# Patient Record
Sex: Male | Born: 1980 | Race: Black or African American | Hispanic: No | Marital: Single
Health system: Southern US, Community
[De-identification: ages and names within clinical notes are randomized; demographics above are authoritative.]

## PROBLEM LIST (undated history)

## (undated) DIAGNOSIS — I10 Essential (primary) hypertension: Secondary | ICD-10-CM

## (undated) DIAGNOSIS — E785 Hyperlipidemia, unspecified: Secondary | ICD-10-CM

## (undated) HISTORY — DX: Hyperlipidemia, unspecified: E78.5

## (undated) HISTORY — DX: Essential (primary) hypertension: I10

---
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
## 2010-02-08 HISTORY — PX: SHOULDER ARTHROSCOPY: SHX128

## 2010-09-26 ENCOUNTER — Emergency Department (HOSPITAL_COMMUNITY)
Admission: EM | Admit: 2010-09-26 | Discharge: 2010-09-26 | Disposition: A | Discharge status: Home / Self Care | Payer: Federal, State, Local not specified - PPO | Attending: Emergency Medicine | Admitting: Emergency Medicine

## 2010-09-26 ENCOUNTER — Emergency Department (HOSPITAL_COMMUNITY): Payer: Federal, State, Local not specified - PPO

## 2010-09-26 DIAGNOSIS — M25519 Pain in unspecified shoulder: Secondary | ICD-10-CM | POA: Insufficient documentation

## 2010-09-26 DIAGNOSIS — M7989 Other specified soft tissue disorders: Secondary | ICD-10-CM | POA: Insufficient documentation

## 2010-09-26 DIAGNOSIS — IMO0002 Reserved for concepts with insufficient information to code with codable children: Secondary | ICD-10-CM | POA: Insufficient documentation

## 2010-09-26 DIAGNOSIS — E7800 Pure hypercholesterolemia, unspecified: Secondary | ICD-10-CM | POA: Insufficient documentation

## 2010-09-26 DIAGNOSIS — S8000XA Contusion of unspecified knee, initial encounter: Secondary | ICD-10-CM | POA: Insufficient documentation

## 2010-09-26 DIAGNOSIS — M25569 Pain in unspecified knee: Secondary | ICD-10-CM | POA: Insufficient documentation

## 2010-09-26 DIAGNOSIS — W1801XA Striking against sports equipment with subsequent fall, initial encounter: Secondary | ICD-10-CM | POA: Insufficient documentation

## 2015-07-25 DIAGNOSIS — J029 Acute pharyngitis, unspecified: Secondary | ICD-10-CM | POA: Diagnosis not present

## 2015-08-13 DIAGNOSIS — M79674 Pain in right toe(s): Secondary | ICD-10-CM | POA: Diagnosis not present

## 2015-08-20 DIAGNOSIS — G4733 Obstructive sleep apnea (adult) (pediatric): Secondary | ICD-10-CM | POA: Diagnosis not present

## 2015-09-13 DIAGNOSIS — K080 Exfoliation of teeth due to systemic causes: Secondary | ICD-10-CM | POA: Diagnosis not present

## 2015-09-25 DIAGNOSIS — M25512 Pain in left shoulder: Secondary | ICD-10-CM | POA: Diagnosis not present

## 2016-03-29 DIAGNOSIS — K080 Exfoliation of teeth due to systemic causes: Secondary | ICD-10-CM | POA: Diagnosis not present

## 2016-05-22 DIAGNOSIS — K080 Exfoliation of teeth due to systemic causes: Secondary | ICD-10-CM | POA: Diagnosis not present

## 2016-08-20 DIAGNOSIS — E782 Mixed hyperlipidemia: Secondary | ICD-10-CM | POA: Diagnosis not present

## 2016-08-20 DIAGNOSIS — Z0000 Encounter for general adult medical examination without abnormal findings: Secondary | ICD-10-CM | POA: Diagnosis not present

## 2016-10-23 DIAGNOSIS — K080 Exfoliation of teeth due to systemic causes: Secondary | ICD-10-CM | POA: Diagnosis not present

## 2017-04-28 DIAGNOSIS — K080 Exfoliation of teeth due to systemic causes: Secondary | ICD-10-CM | POA: Diagnosis not present

## 2017-07-21 DIAGNOSIS — G4733 Obstructive sleep apnea (adult) (pediatric): Secondary | ICD-10-CM | POA: Diagnosis not present

## 2017-08-24 DIAGNOSIS — I10 Essential (primary) hypertension: Secondary | ICD-10-CM | POA: Diagnosis not present

## 2017-08-24 DIAGNOSIS — Z0000 Encounter for general adult medical examination without abnormal findings: Secondary | ICD-10-CM | POA: Diagnosis not present

## 2017-08-24 DIAGNOSIS — R7301 Impaired fasting glucose: Secondary | ICD-10-CM | POA: Diagnosis not present

## 2017-08-24 DIAGNOSIS — G4730 Sleep apnea, unspecified: Secondary | ICD-10-CM | POA: Diagnosis not present

## 2017-08-24 DIAGNOSIS — E7800 Pure hypercholesterolemia, unspecified: Secondary | ICD-10-CM | POA: Diagnosis not present

## 2017-11-03 DIAGNOSIS — K080 Exfoliation of teeth due to systemic causes: Secondary | ICD-10-CM | POA: Diagnosis not present

## 2017-11-21 DIAGNOSIS — G4733 Obstructive sleep apnea (adult) (pediatric): Secondary | ICD-10-CM | POA: Diagnosis not present

## 2018-02-27 DIAGNOSIS — R7303 Prediabetes: Secondary | ICD-10-CM | POA: Diagnosis not present

## 2018-02-27 DIAGNOSIS — Z113 Encounter for screening for infections with a predominantly sexual mode of transmission: Secondary | ICD-10-CM | POA: Diagnosis not present

## 2018-02-27 DIAGNOSIS — I10 Essential (primary) hypertension: Secondary | ICD-10-CM | POA: Diagnosis not present

## 2018-02-27 DIAGNOSIS — G4730 Sleep apnea, unspecified: Secondary | ICD-10-CM | POA: Diagnosis not present

## 2018-02-27 DIAGNOSIS — E7800 Pure hypercholesterolemia, unspecified: Secondary | ICD-10-CM | POA: Diagnosis not present

## 2018-08-28 DIAGNOSIS — Z0000 Encounter for general adult medical examination without abnormal findings: Secondary | ICD-10-CM | POA: Diagnosis not present

## 2018-08-28 DIAGNOSIS — R7303 Prediabetes: Secondary | ICD-10-CM | POA: Diagnosis not present

## 2018-08-28 DIAGNOSIS — G4730 Sleep apnea, unspecified: Secondary | ICD-10-CM | POA: Diagnosis not present

## 2018-08-28 DIAGNOSIS — E7800 Pure hypercholesterolemia, unspecified: Secondary | ICD-10-CM | POA: Diagnosis not present

## 2018-08-28 DIAGNOSIS — I10 Essential (primary) hypertension: Secondary | ICD-10-CM | POA: Diagnosis not present

## 2018-09-26 DIAGNOSIS — G4733 Obstructive sleep apnea (adult) (pediatric): Secondary | ICD-10-CM | POA: Diagnosis not present

## 2018-11-06 DIAGNOSIS — K080 Exfoliation of teeth due to systemic causes: Secondary | ICD-10-CM | POA: Diagnosis not present

## 2019-01-26 ENCOUNTER — Ambulatory Visit: Payer: Federal, State, Local not specified - PPO | Attending: Internal Medicine

## 2019-01-26 ENCOUNTER — Other Ambulatory Visit: Payer: Self-pay

## 2019-01-26 DIAGNOSIS — Z20828 Contact with and (suspected) exposure to other viral communicable diseases: Secondary | ICD-10-CM | POA: Diagnosis not present

## 2019-01-26 DIAGNOSIS — Z20822 Contact with and (suspected) exposure to covid-19: Secondary | ICD-10-CM

## 2019-01-27 LAB — NOVEL CORONAVIRUS, NAA: SARS-CoV-2, NAA: NOT DETECTED

## 2019-03-01 DIAGNOSIS — E7800 Pure hypercholesterolemia, unspecified: Secondary | ICD-10-CM | POA: Diagnosis not present

## 2019-03-01 DIAGNOSIS — R7301 Impaired fasting glucose: Secondary | ICD-10-CM | POA: Diagnosis not present

## 2019-03-01 DIAGNOSIS — G4730 Sleep apnea, unspecified: Secondary | ICD-10-CM | POA: Diagnosis not present

## 2019-03-01 DIAGNOSIS — I10 Essential (primary) hypertension: Secondary | ICD-10-CM | POA: Diagnosis not present

## 2019-03-01 DIAGNOSIS — K921 Melena: Secondary | ICD-10-CM | POA: Diagnosis not present

## 2019-03-05 DIAGNOSIS — G4733 Obstructive sleep apnea (adult) (pediatric): Secondary | ICD-10-CM | POA: Diagnosis not present

## 2019-08-30 DIAGNOSIS — R7309 Other abnormal glucose: Secondary | ICD-10-CM | POA: Diagnosis not present

## 2019-08-30 DIAGNOSIS — G4730 Sleep apnea, unspecified: Secondary | ICD-10-CM | POA: Diagnosis not present

## 2019-08-30 DIAGNOSIS — R7303 Prediabetes: Secondary | ICD-10-CM | POA: Diagnosis not present

## 2019-08-30 DIAGNOSIS — K625 Hemorrhage of anus and rectum: Secondary | ICD-10-CM | POA: Diagnosis not present

## 2019-08-30 DIAGNOSIS — I10 Essential (primary) hypertension: Secondary | ICD-10-CM | POA: Diagnosis not present

## 2019-08-30 DIAGNOSIS — Z23 Encounter for immunization: Secondary | ICD-10-CM | POA: Diagnosis not present

## 2019-08-30 DIAGNOSIS — Z0000 Encounter for general adult medical examination without abnormal findings: Secondary | ICD-10-CM | POA: Diagnosis not present

## 2019-08-30 DIAGNOSIS — E7800 Pure hypercholesterolemia, unspecified: Secondary | ICD-10-CM | POA: Diagnosis not present

## 2019-10-12 DIAGNOSIS — G4733 Obstructive sleep apnea (adult) (pediatric): Secondary | ICD-10-CM | POA: Diagnosis not present

## 2020-02-22 DIAGNOSIS — Z20828 Contact with and (suspected) exposure to other viral communicable diseases: Secondary | ICD-10-CM | POA: Diagnosis not present

## 2020-02-29 DIAGNOSIS — I10 Essential (primary) hypertension: Secondary | ICD-10-CM | POA: Diagnosis not present

## 2020-02-29 DIAGNOSIS — E7800 Pure hypercholesterolemia, unspecified: Secondary | ICD-10-CM | POA: Diagnosis not present

## 2020-02-29 DIAGNOSIS — G4730 Sleep apnea, unspecified: Secondary | ICD-10-CM | POA: Diagnosis not present

## 2020-02-29 DIAGNOSIS — R7303 Prediabetes: Secondary | ICD-10-CM | POA: Diagnosis not present

## 2020-06-09 DIAGNOSIS — R1032 Left lower quadrant pain: Secondary | ICD-10-CM | POA: Diagnosis not present

## 2020-06-12 ENCOUNTER — Other Ambulatory Visit: Payer: Self-pay

## 2020-06-12 ENCOUNTER — Other Ambulatory Visit: Payer: Self-pay | Admitting: Internal Medicine

## 2020-06-12 ENCOUNTER — Ambulatory Visit
Admission: RE | Admit: 2020-06-12 | Discharge: 2020-06-12 | Disposition: A | Discharge status: Home / Self Care | Payer: Federal, State, Local not specified - PPO | Source: Ambulatory Visit | Attending: Internal Medicine | Admitting: Internal Medicine

## 2020-06-12 DIAGNOSIS — K5730 Diverticulosis of large intestine without perforation or abscess without bleeding: Secondary | ICD-10-CM | POA: Diagnosis not present

## 2020-06-12 DIAGNOSIS — R1032 Left lower quadrant pain: Secondary | ICD-10-CM

## 2020-06-12 DIAGNOSIS — R109 Unspecified abdominal pain: Secondary | ICD-10-CM | POA: Diagnosis not present

## 2020-09-17 DIAGNOSIS — I10 Essential (primary) hypertension: Secondary | ICD-10-CM | POA: Diagnosis not present

## 2020-09-17 DIAGNOSIS — E7800 Pure hypercholesterolemia, unspecified: Secondary | ICD-10-CM | POA: Diagnosis not present

## 2020-09-17 DIAGNOSIS — R7303 Prediabetes: Secondary | ICD-10-CM | POA: Diagnosis not present

## 2020-09-17 DIAGNOSIS — G4730 Sleep apnea, unspecified: Secondary | ICD-10-CM | POA: Diagnosis not present

## 2020-09-17 DIAGNOSIS — Z0000 Encounter for general adult medical examination without abnormal findings: Secondary | ICD-10-CM | POA: Diagnosis not present

## 2021-02-13 ENCOUNTER — Other Ambulatory Visit: Payer: Self-pay

## 2021-02-13 ENCOUNTER — Ambulatory Visit (HOSPITAL_BASED_OUTPATIENT_CLINIC_OR_DEPARTMENT_OTHER): Payer: Federal, State, Local not specified - PPO | Admitting: Orthopaedic Surgery

## 2021-02-13 DIAGNOSIS — M542 Cervicalgia: Secondary | ICD-10-CM

## 2021-02-13 NOTE — Progress Notes (Signed)
Chief Complaint: Right upper trapezial pain     History of Present Illness:    Maxi A Rocks is a 41 y.o. male with right upper trapezial pain ongoing since October.  He says that this is a tightness type of pain.  He did recently get a new bed that came with a new pillow and while this helped his back pain he has been having neck pain as well.  Denies any radiation to the arm or numbness in the arm.  Denies any weakness in the arm.    Surgical History:   None  PMH/PSH/Family History/Social History/Meds/Allergies:   No past medical history on file.  Social History   Socioeconomic History   Marital status: Single    Spouse name: Not on file   Number of children: Not on file   Years of education: Not on file   Highest education level: Not on file  Occupational History   Not on file  Tobacco Use   Smoking status: Not on file   Smokeless tobacco: Not on file  Substance and Sexual Activity   Alcohol use: Not on file   Drug use: Not on file   Sexual activity: Not on file  Other Topics Concern   Not on file  Social History Narrative   Not on file   Social Determinants of Health   Financial Resource Strain: Not on file  Food Insecurity: Not on file  Transportation Needs: Not on file  Physical Activity: Not on file  Stress: Not on file  Social Connections: Not on file   No family history on file. Not on File No current outpatient medications on file.   No current facility-administered medications for this visit.   No results found.  Review of Systems:   A ROS was performed including pertinent positives and negatives as documented in the HPI.  Physical Exam :   Constitutional: NAD and appears stated age Neurological: Alert and oriented Psych: Appropriate affect and cooperative There were no vitals taken for this visit.   Comprehensive Musculoskeletal Exam:    He has tenderness palpation about the right upper trapezius.   Skin no pain with rotation about the neck to 30 degrees bilaterally.  No pain with forward flexion of the neck.  Full strength in bilateral upper extremities.  Sensation is intact in bilateral upper extremities  Imaging:   None  I personally reviewed and interpreted the radiographs.   Assessment:   42 year old male with pain in the right neck consistent with trapezial spasm type pain.  He does have occasional clicking which I have described is consistent with a possible etiology of his neck pain.  We did describe multiple treatment options including a neck traction brace which she can use daily.  I would also like to send him for some physical therapy for some upper trapezial needling as well as stretching and strengthening.  We also discussed at length about ergonomics of the neck including being cognizant to limit forward elevation with his smart phone as well as to improve ergonomics of his pillow.  Plan :    -Follow-up as needed     I personally saw and evaluated the patient, and participated in the management and treatment plan.  Huel Cote, MD Attending Physician, Orthopedic Surgery  This document was dictated using Dragon voice  recognition software. A reasonable attempt at proof reading has been made to minimize errors.

## 2021-02-25 ENCOUNTER — Ambulatory Visit: Payer: Federal, State, Local not specified - PPO | Attending: Orthopaedic Surgery

## 2021-02-25 ENCOUNTER — Other Ambulatory Visit: Payer: Self-pay

## 2021-02-25 DIAGNOSIS — M542 Cervicalgia: Secondary | ICD-10-CM | POA: Diagnosis not present

## 2021-02-25 DIAGNOSIS — R252 Cramp and spasm: Secondary | ICD-10-CM | POA: Insufficient documentation

## 2021-02-25 DIAGNOSIS — R293 Abnormal posture: Secondary | ICD-10-CM | POA: Insufficient documentation

## 2021-02-25 NOTE — Patient Instructions (Addendum)
Trigger Point Dry Needling  What is Trigger Point Dry Needling (DN)? DN is a physical therapy technique used to treat muscle pain and dysfunction. Specifically, DN helps deactivate muscle trigger points (muscle knots).  A thin filiform needle is used to penetrate the skin and stimulate the underlying trigger point. The goal is for a local twitch response (LTR) to occur and for the trigger point to relax. No medication of any kind is injected during the procedure.   What Does Trigger Point Dry Needling Feel Like?  The procedure feels different for each individual patient. Some patients report that they do not actually feel the needle enter the skin and overall the process is not painful. Very mild bleeding may occur. However, many patients feel a deep cramping in the muscle in which the needle was inserted. This is the local twitch response.   How Will I feel after the treatment? Soreness is normal, and the onset of soreness may not occur for a few hours. Typically this soreness does not last longer than two days.  Bruising is uncommon, however; ice can be used to decrease any possible bruising.  In rare cases feeling tired or nauseous after the treatment is normal. In addition, your symptoms may get worse before they get better, this period will typically not last longer than 24 hours.   What Can I do After My Treatment? Increase your hydration by drinking more water for the next 24 hours. You may place ice or heat on the areas treated that have become sore, however, do not use heat on inflamed or bruised areas. Heat often brings more relief post needling. You can continue your regular activities, but vigorous activity is not recommended initially after the treatment for 24 hours. DN is best combined with other physical therapy such as strengthening, stretching, and other therapies. Access Code: [email or web address] URL: [email or web address] Date: 02/25/2021 Prepared by:  Tresa Endo  Exercises Seated Cervical Flexion AROM - 3 x daily - 7 x weekly - 1 sets - 3 reps - 20 hold Seated Cervical Sidebending AROM - 3 x daily - 7 x weekly - 1 sets - 3 reps - 20 hold Seated Cervical Rotation AROM - 3 x daily - 7 x weekly - 1 sets - 3 reps - 20 hold Seated Correct Posture - 1 x daily - 7 x weekly - 3 sets - 10 reps Cervical AROM Flexion and Rotation - 3 x daily - 7 x weekly - 1 sets - 10 reps - 20 hold  [facility]  [address] [place] [address].  [phone]

## 2021-02-25 NOTE — Therapy (Signed)
[facility] @ [place], [place], [address] Phone: [phone]   Fax:  [phone]  Physical Therapy Evaluation  Patient Details  Name: BRET KLOCKO MRN: [id number] Date of Birth: 05-21-1980 Referring Provider (PT): Vanetta Mulders, MD   Encounter Date: 02/25/2021   PT End of Session - 02/25/21 1010     Visit Number 1    Date for PT Re-Evaluation 04/22/21    Authorization Type BCBS Federal    PT Start Time 0930    PT Stop Time 1010    PT Time Calculation (min) 40 min    Activity Tolerance Patient tolerated treatment well    Behavior During Therapy [facility] for tasks assessed/performed             Past Medical History:  Diagnosis Date   Hyperlipidemia    Hypertension     Past Surgical History:  Procedure Laterality Date   SHOULDER ARTHROSCOPY Right 2012    There were no vitals filed for this visit.    Subjective Assessment - 02/25/21 0932     Subjective Pt presentst with Rt sided neck pain that began 11/2020 without incident or injury.  Gradual onset around the time that he started lifting weights.  Pt does not perceive that this was the cause of his pain.  Pt had an old bed and began experiencing neck and low back pain.  Back pain resolved and neck pain didn't.    Pertinent History HTN, Rt shoulder scope (2012)    Diagnostic tests none    Patient Stated Goals reduce neck pain, reduce muscle tension    Currently in Pain? Yes    Pain Score 1    5-7/10   Pain Location Neck    Pain Orientation Right    Pain Descriptors / Indicators Sore    Pain Type Chronic pain    Pain Onset More than a month ago    Pain Frequency Intermittent    Aggravating Factors  looking down, turning Rt, reaching overhead with Rt UE    Pain Relieving Factors sitting straighter, change of position                [facility] PT Assessment - 02/25/21 0001       Assessment   Medical Diagnosis neck pain on Rt side     Referring Provider (PT) Vanetta Mulders, MD    Onset Date/Surgical Date 11/12/20    Hand Dominance Right    Next MD Visit none    Prior Therapy none      Precautions   Precautions None      Restrictions   Weight Bearing Restrictions No      Balance Screen   Has the patient fallen in the past 6 months No    Has the patient had a decrease in activity level because of a fear of falling?  No    Is the patient reluctant to leave their home because of a fear of falling?  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  Cervical A/ROM is full with stiffness and pain at end range with Rt rotation and sidebending      PROM   Overall PROM  Within functional limits for tasks performed      Strength   Overall Strength Deficits    Overall Strength Comments postural strength 4/5, shoulders 4+/5      Palpation   Spinal mobility reduced PA mobility without pain in cervical and upper thoracic spine    Palpation comment tension and trigger points Rt and Lt upper traps, levator, cervical paraspinals      Transfers   Transfers Independent with all Transfers      Ambulation/Gait   Gait Pattern Within Functional Limits                        Objective  measurements completed on examination: See above findings.                PT Education - 02/25/21 1003     Education Details Access Code: [email or web address]    Person(s) Educated Patient    Methods Explanation;Demonstration;Verbal cues    Comprehension Verbalized understanding;Returned demonstration              PT Short Term Goals - 02/25/21 0945       PT SHORT TERM GOAL #1   Title be independent in initial HEP    Time 4    Period Weeks    Status New    Target Date 03/25/21      PT SHORT TERM GOAL #2   Title report a 30% reduction in neck pain with turning head and looking down    Time 4    Period Weeks    Status New    Target Date 03/25/21      PT SHORT TERM GOAL #3   Title report postural corrections with desk/computer work to improve alignment and reduce strain on neck and postural stabilizers    Time 4    Period Weeks    Status New    Target Date 03/25/21               PT Long Term Goals - 02/25/21 0946       PT LONG TERM GOAL #1   Title be independent in advanced HEP    Time 8    Period Weeks    Target Date 04/22/21      PT LONG TERM GOAL #2   Title report > or = to 70% reduction in Rt neck pain with daily tasks and driving    Time 8    Period Weeks    Status New    Target Date 04/22/21      PT LONG TERM GOAL #3   Title improve FOTO to > or = to 79    Baseline 71    Time 8    Period Weeks    Status New    Target Date 04/22/21      PT LONG TERM GOAL #4   Title improve postural strength to sit  with neutral posture > or = to 75% of the time  and report corrections with daily tasks    Time 8    Period Weeks    Status New    Target Date 04/22/21                    Plan - 02/25/21 1013     Clinical Impression Statement Pt is a 41 y.o. male with right upper trapezial pain ongoing since October.  He says that pain is sore and stff.  He did recently get a new bed that came with a new pillow and while this helped his back pain,  but he has been having continued neck pain.  Denies any radiation to the arm or numbness in the arm.  Pt rates pain as 0-7/10.  Pain increases in the Rt neck with looking down and then looking up, turning head to the Rt and with reaching overhead.  Pt is using a cervical traction (inflatable collar) per MD recommendation for symptoms.  Pt works at the computer frequently and reports that his posture is not good while doing this. Pt with full UE and cervical A/ROM with tension and pain reported at end range Rt sided motion.  Pt with reduced segmental mobility and tension in the cervical and upper thoracic spine.  Poor seated posture and is able to correct given verbal cues.  Pt will benefit from skilled PT to address neck tension, pain and postural deficits.    Examination-Activity Limitations Sit    Examination-Participation Restrictions Driving    Stability/Clinical Decision Making Stable/Uncomplicated    Clinical Decision Making Low    Rehab Potential Excellent    PT Frequency 1x / week    PT Duration 8 weeks    PT Treatment/Interventions ADLs/Self Care Home Management;Moist Heat;Electrical Stimulation;Cryotherapy;Traction;Therapeutic activities;Therapeutic exercise;Neuromuscular re-education;Manual techniques;Taping;Dry needling;Passive range of motion;Spinal Manipulations;Joint Manipulations    PT Next Visit Plan DN to neck, upper traps and occiput next session, review HEP, add postural strength.  Pt will have limited attendance due to work schedule    PT Home Exercise Plan Access Code: [email or web address]    Consulted and Agree with Plan of Care Patient             Patient will benefit from skilled therapeutic intervention in order to improve the following deficits and impairments:  Decreased activity tolerance, Decreased strength, Postural dysfunction, Pain, Decreased endurance, Increased muscle spasms  Visit Diagnosis: Cervicalgia - Plan: PT plan of care cert/re-cert  Cramp and spasm - Plan: PT  plan of care cert/re-cert  Abnormal posture - Plan: PT plan of care cert/re-cert     Problem List There are no problems to display for this patient.  Sigurd Sos, PT 02/25/21 10:17 AM   [facility] @ [place], [place], [address] Phone: [phone]   Fax:  [phone]  Name: RYLEE TOOLE MRN: [id number] Date of Birth: June 04, 1980

## 2021-02-27 DIAGNOSIS — G4733 Obstructive sleep apnea (adult) (pediatric): Secondary | ICD-10-CM | POA: Diagnosis not present

## 2021-03-09 ENCOUNTER — Ambulatory Visit: Payer: Federal, State, Local not specified - PPO

## 2021-03-09 ENCOUNTER — Other Ambulatory Visit: Payer: Self-pay

## 2021-03-09 DIAGNOSIS — M542 Cervicalgia: Secondary | ICD-10-CM

## 2021-03-09 DIAGNOSIS — R252 Cramp and spasm: Secondary | ICD-10-CM | POA: Diagnosis not present

## 2021-03-09 DIAGNOSIS — R293 Abnormal posture: Secondary | ICD-10-CM

## 2021-03-09 NOTE — Therapy (Signed)
[facility] @ [facility] [address] [place], [place], [address] Phone: [phone]   Fax:  [phone]  Physical Therapy Treatment  Patient Details  Name: Thomas Tate MRN: [id number] Date of Birth: 04/19/1980 Referring Provider (PT): Huel CoteBokshan, Steven, MD   Encounter Date: 03/09/2021   PT End of Session - 03/09/21 1218     Visit Number 2    Date for PT Re-Evaluation 04/22/21    Authorization Type BCBS Federal    PT Start Time 1136    PT Stop Time 1214    PT Time Calculation (min) 38 min    Activity Tolerance Patient tolerated treatment well    Behavior During Therapy [facility] for tasks assessed/performed             Past Medical History:  Diagnosis Date   Hyperlipidemia    Hypertension     Past Surgical History:  Procedure Laterality Date   SHOULDER ARTHROSCOPY Right 2012    There were no vitals filed for this visit.   Subjective Assessment - 03/09/21 1143     Subjective Pt states that he forgot about exercise safter first session. However, he reports that this weekend was a good weekend as far as stiffness goes.    Pertinent History HTN, Rt shoulder scope (2012)    Patient Stated Goals reduce neck pain, reduce muscle tension    Currently in Pain? Yes    Pain Score 5     Pain Location Neck    Pain Orientation Right    Pain Descriptors / Indicators Tightness    Pain Type Chronic pain    Pain Onset More than a month ago    Pain Frequency Intermittent    Aggravating Factors  looking down, turning Rt, reaching overhead    Pain Relieving Factors change of postion, sitting with good posture    Multiple Pain Sites No                               [facility] Adult PT Treatment/Exercise - 03/09/21 0001       Exercises   Exercises Neck      Neck Exercises: Standing   Other Standing Exercises Rows 2 x 10 red band; Bil UE extensions 2 x 10 red band      Neck Exercises: Seated   Cervical Rotation 20  reps   with red band for resistance   Money 20 reps   red band   Upper Extremity D2 20 reps;Flexion   red band   Other Seated Exercise Horizontal abduction 2 x 10 red band, chin tuck      Manual Therapy   Manual Therapy Soft tissue mobilization;Myofascial release    Soft tissue mobilization sof ttissue mobilization to Rt cervical muscles    Myofascial Release dry needling to Rt cervical muscles              Trigger Point Dry Needling - 03/09/21 0001     Consent Given? Yes    Education Handout Provided Yes    Muscles Treated Head and Neck Upper trapezius;Suboccipitals;Cervical multifidi    Upper Trapezius Response Twitch reponse elicited;Palpable increased muscle length    Suboccipitals Response Twitch response elicited;Palpable increased muscle length    Cervical multifidi Response Twitch reponse elicited;Palpable increased muscle length                   PT Education - 03/09/21 1206  Education Details Pt education performed on dry needling and he would like to trial today. HEP updated. [id number]    Person(s) Educated Patient    Methods Explanation;Demonstration;Tactile cues;Verbal cues;Handout    Comprehension Verbalized understanding              PT Short Term Goals - 02/25/21 0945       PT SHORT TERM GOAL #1   Title be independent in initial HEP    Time 4    Period Weeks    Status New    Target Date 03/25/21      PT SHORT TERM GOAL #2   Title report a 30% reduction in neck pain with turning head and looking down    Time 4    Period Weeks    Status New    Target Date 03/25/21      PT SHORT TERM GOAL #3   Title report postural corrections with desk/computer work to improve alignment and reduce strain on neck and postural stabilizers    Time 4    Period Weeks    Status New    Target Date 03/25/21               PT Long Term Goals - 02/25/21 0946       PT LONG TERM GOAL #1   Title be independent in advanced HEP    Time 8    Period  Weeks    Target Date 04/22/21      PT LONG TERM GOAL #2   Title report > or = to 70% reduction in Rt neck pain with daily tasks and driving    Time 8    Period Weeks    Status New    Target Date 04/22/21      PT LONG TERM GOAL #3   Title improve FOTO to > or = to 79    Baseline 71    Time 8    Period Weeks    Status New    Target Date 04/22/21      PT LONG TERM GOAL #4   Title improve postural strength to sit with neutral posture > or = to 75% of the time  and report corrections with daily tasks    Time 8    Period Weeks    Status New    Target Date 04/22/21                   Plan - 03/09/21 1211     Clinical Impression Statement Dry needling and manual techniques performed this session to address trigger points and tightness in Rt cervical musculature; pt demosntrated notable twitch response and release of muscular tension with improve A/ROM and decreased apprehension with movement. He tolerated all exercise progressions well demonstrated by no increase in pain and improved postural awareness. We discussed lumbar support while sitting at work to help improve posture up to head/neck; he was agreeable. Believe postural correction and strengthening will help to improve Rt sided cervical pain. He will continue to benefit from skilled PT intervention in order to work towards goal completion.    PT Treatment/Interventions ADLs/Self Care Home Management;Moist Heat;Electrical Stimulation;Cryotherapy;Traction;Therapeutic activities;Therapeutic exercise;Neuromuscular re-education;Manual techniques;Taping;Dry needling;Passive range of motion;Spinal Manipulations;Joint Manipulations    PT Next Visit Plan DN to neck, upper traps and occiput next session, review HEP, add postural strength.  Pt will have limited attendance due to work schedule.    PT Home Exercise Plan Access Code: [email or web address]    Consulted  and Agree with Plan of Care Patient             Patient will benefit from  skilled therapeutic intervention in order to improve the following deficits and impairments:  Decreased activity tolerance, Decreased strength, Postural dysfunction, Pain, Decreased endurance, Increased muscle spasms  Visit Diagnosis: Cervicalgia  Cramp and spasm  Abnormal posture     Problem List There are no problems to display for this patient.   Julio Alm, PT, DPT01/30/2312:20 PM   [facility] @ [facility] [address] [place], [place], [address] Phone: [phone]   Fax:  [phone]  Name: Thomas Tate MRN: [id number] Date of Birth: 1980-04-22

## 2021-03-12 DIAGNOSIS — J029 Acute pharyngitis, unspecified: Secondary | ICD-10-CM | POA: Diagnosis not present

## 2021-03-12 DIAGNOSIS — Z03818 Encounter for observation for suspected exposure to other biological agents ruled out: Secondary | ICD-10-CM | POA: Diagnosis not present

## 2021-03-16 ENCOUNTER — Other Ambulatory Visit: Payer: Self-pay

## 2021-03-16 ENCOUNTER — Ambulatory Visit: Payer: Federal, State, Local not specified - PPO | Attending: Orthopaedic Surgery

## 2021-03-16 DIAGNOSIS — R252 Cramp and spasm: Secondary | ICD-10-CM | POA: Insufficient documentation

## 2021-03-16 DIAGNOSIS — M542 Cervicalgia: Secondary | ICD-10-CM | POA: Insufficient documentation

## 2021-03-16 DIAGNOSIS — R293 Abnormal posture: Secondary | ICD-10-CM | POA: Insufficient documentation

## 2021-03-16 NOTE — Therapy (Signed)
[facility] @ [facility] [address] [place], [place], [address] Phone: [phone]   Fax:  [phone]  Physical Therapy Treatment  Patient Details  Name: Thomas Tate MRN: [id number] Date of Birth: 1981-01-30 Referring Provider (PT): Huel Cote, MD   Encounter Date: 03/16/2021   PT End of Session - 03/16/21 1148     Visit Number 3    Date for PT Re-Evaluation 04/22/21    Authorization Type BCBS Federal    PT Start Time 1146    PT Stop Time 1225    PT Time Calculation (min) 39 min    Activity Tolerance Patient tolerated treatment well    Behavior During Therapy [facility] for tasks assessed/performed             Past Medical History:  Diagnosis Date   Hyperlipidemia    Hypertension     Past Surgical History:  Procedure Laterality Date   SHOULDER ARTHROSCOPY Right 2012    There were no vitals filed for this visit.   Subjective Assessment - 03/16/21 1149     Subjective Pt states that he woke up and his neck was kind of stiff, but that has worked out over the course of the morning. He did not feel any soreness after last treatment related to the DN, but he did feel some residual soreness after exercises. He states that he noticed large improvement in cervical A/ROM the next day. He feels like he continues to feel better than prior to DN.    Currently in Pain? No/denies    Multiple Pain Sites No                               [facility] Adult PT Treatment/Exercise - 03/16/21 0001       Neck Exercises: Standing   Other Standing Exercises Rows 2 x 10 red band; Bil UE extensions 2 x 10 green band; Bil UE extension green 2 x 10      Neck Exercises: Seated   Neck Retraction 20 reps    Neck Retraction Limitations with red band for resistance    Money 20 reps   red   Upper Extremity D2 20 reps;Flexion   red band   Other Seated Exercise Horizontal abduction 2 x 10 red band, chin tuck    Other Seated Exercise  red band      Neck Exercises: Prone   Other Prone Exercise Swiss ball Is/Ys/Ts, no weight, 2 x 10 each direction    Other Prone Exercise Counter push-ups 2 x 10      Manual Therapy   Manual Therapy Soft tissue mobilization;Myofascial release;Joint mobilization    Joint Mobilization CPA to C3-T3 grade 3    Soft tissue mobilization sof ttissue mobilization to Rt cervical muscles    Myofascial Release dry needling to Rt cervical muscles              Trigger Point Dry Needling - 03/16/21 0001     Consent Given? Yes    Muscles Treated Head and Neck Upper trapezius;Suboccipitals;Cervical multifidi    Upper Trapezius Response Twitch reponse elicited;Palpable increased muscle length    Suboccipitals Response Twitch response elicited;Palpable increased muscle length    Cervical multifidi Response Twitch reponse elicited;Palpable increased muscle length                   PT Education - 03/16/21 1225     Education  Details Pt education performed on new exercise progressions; HEP not updated this session due to difficulty getting to current HEP.    Person(s) Educated Patient    Methods Explanation;Demonstration;Tactile cues;Verbal cues    Comprehension Verbalized understanding              PT Short Term Goals - 02/25/21 0945       PT SHORT TERM GOAL #1   Title be independent in initial HEP    Time 4    Period Weeks    Status New    Target Date 03/25/21      PT SHORT TERM GOAL #2   Title report a 30% reduction in neck pain with turning head and looking down    Time 4    Period Weeks    Status New    Target Date 03/25/21      PT SHORT TERM GOAL #3   Title report postural corrections with desk/computer work to improve alignment and reduce strain on neck and postural stabilizers    Time 4    Period Weeks    Status New    Target Date 03/25/21               PT Long Term Goals - 02/25/21 0946       PT LONG TERM GOAL #1   Title be independent in  advanced HEP    Time 8    Period Weeks    Target Date 04/22/21      PT LONG TERM GOAL #2   Title report > or = to 70% reduction in Rt neck pain with daily tasks and driving    Time 8    Period Weeks    Status New    Target Date 04/22/21      PT LONG TERM GOAL #3   Title improve FOTO to > or = to 79    Baseline 71    Time 8    Period Weeks    Status New    Target Date 04/22/21      PT LONG TERM GOAL #4   Title improve postural strength to sit with neutral posture > or = to 75% of the time  and report corrections with daily tasks    Time 8    Period Weeks    Status New    Target Date 04/22/21                   Plan - 03/16/21 1224     Clinical Impression Statement Pt making good progress demonstrated by improved cervical A/ROM and decreased pain. Triger points and restriction still palpated throughout Rt cervical muscles and shoulder girdle in addition to cervical/upper thoracic joint mobility. He responded very well to treatment with significant twitch response to with DN and improved myofascial/joint restriction. He was able to progress resistance to several exercises and addition of counter push-up and swiss ball prone Is/Ys/Ts with good form and appropriate challenge; some verbal cues required to improve posture and appropriate muscle activation. He iwll continue to benefit from skilled PT intervention in order to progress funcitonal strengthening and work towards goal completion.    PT Treatment/Interventions ADLs/Self Care Home Management;Moist Heat;Electrical Stimulation;Cryotherapy;Traction;Therapeutic activities;Therapeutic exercise;Neuromuscular re-education;Manual techniques;Taping;Dry needling;Passive range of motion;Spinal Manipulations;Joint Manipulations    PT Next Visit Plan DN to neck, upper traps and occiput next session, review HEP, add postural strength.  Pt will have limited attendance due to work schedule.    PT Home Exercise  Plan Access Code: [email or web address]     Consulted and Agree with Plan of Care Patient             Patient will benefit from skilled therapeutic intervention in order to improve the following deficits and impairments:  Decreased activity tolerance, Decreased strength, Postural dysfunction, Pain, Decreased endurance, Increased muscle spasms  Visit Diagnosis: Cervicalgia  Cramp and spasm  Abnormal posture     Problem List There are no problems to display for this patient.  Julio Alm, PT, DPT02/07/2310:27 PM   [facility] @ [facility] [address] [place], [place], [address] Phone: [phone]   Fax:  [phone]  Name: Thomas Tate MRN: [id number] Date of Birth: January 23, 1981

## 2021-03-23 ENCOUNTER — Ambulatory Visit: Payer: Federal, State, Local not specified - PPO

## 2021-03-23 ENCOUNTER — Other Ambulatory Visit: Payer: Self-pay

## 2021-03-23 DIAGNOSIS — M542 Cervicalgia: Secondary | ICD-10-CM | POA: Diagnosis not present

## 2021-03-23 DIAGNOSIS — R252 Cramp and spasm: Secondary | ICD-10-CM

## 2021-03-23 DIAGNOSIS — R293 Abnormal posture: Secondary | ICD-10-CM | POA: Diagnosis not present

## 2021-03-23 NOTE — Therapy (Signed)
[facility] @ [facility] [address] [place], [place], [address] Phone: [phone]   Fax:  [phone]  Physical Therapy Treatment  Patient Details  Name: Thomas Tate MRN: [id number] Date of Birth: 1980-10-18 Referring Provider (PT): Huel Cote, MD   Encounter Date: 03/23/2021   PT End of Session - 03/23/21 0847     Visit Number 4    Date for PT Re-Evaluation 04/22/21    Authorization Type BCBS Federal    PT Start Time 0848    PT Stop Time 0926    PT Time Calculation (min) 38 min    Activity Tolerance Patient tolerated treatment well    Behavior During Therapy [facility] for tasks assessed/performed             Past Medical History:  Diagnosis Date   Hyperlipidemia    Hypertension     Past Surgical History:  Procedure Laterality Date   SHOULDER ARTHROSCOPY Right 2012    There were no vitals filed for this visit.   Subjective Assessment - 03/23/21 0847     Subjective Pt states that his mid-neck is feeling much better, he is just haivng a littlediscomfort on the Rt side at base of skull. He started going back to the gym last week and made himself very sore, but no increase in neck pain.    Patient Stated Goals reduce neck pain, reduce muscle tension    Currently in Pain? Yes    Pain Score 4     Pain Location Neck    Pain Orientation Right    Pain Descriptors / Indicators Tightness    Pain Type Chronic pain    Pain Onset More than a month ago    Pain Frequency Intermittent    Aggravating Factors  cervical flexion    Pain Relieving Factors movement    Multiple Pain Sites No                               [facility] Adult PT Treatment/Exercise - 03/23/21 0001       Neck Exercises: Theraband   Shoulder Extension 20 reps;Green      Neck Exercises: Standing   Other Standing Exercises Bent rows, 10lbs, performed unilaterally, 2 x 10 bil; Overhead press 2 x 10, 10lb    Other Standing Exercises  Standing Is/Ys/Ts at wall 2 x 10 each      Neck Exercises: Seated   Neck Retraction 10 reps   prone   Money 20 reps   green   Upper Extremity D2 20 reps;Flexion    Theraband Level (UE D2) Level 3 (Green)    Other Seated Exercise Horizontal abduction 2 x 10 red band, chin tuck, green band      Neck Exercises: Prone   Other Prone Exercise Swiss ball Is/Ys/Ts, no weight, 2 x 10 each direction    Other Prone Exercise --      Manual Therapy   Manual Therapy Soft tissue mobilization;Myofascial release;Joint mobilization    Joint Mobilization CPA to C3-T3 grade 3    Soft tissue mobilization sof ttissue mobilization to Rt cervical muscles    Myofascial Release dry needling to Rt cervical muscles              Trigger Point Dry Needling - 03/23/21 0001     Consent Given? Yes    Education Handout Provided Previously provided    Muscles Treated Head and  Neck Upper trapezius   suboccipitals   Upper Trapezius Response Twitch reponse elicited;Palpable increased muscle length    Suboccipitals Response Twitch response elicited;Palpable increased muscle length    Cervical multifidi Response Twitch reponse elicited;Palpable increased muscle length                   PT Education - 03/23/21 0909     Education Details Pt education performed on benefit of returning to gym and any movement in general.    Person(s) Educated Patient    Methods Explanation;Demonstration;Tactile cues;Verbal cues    Comprehension Verbalized understanding              PT Short Term Goals - 02/25/21 0945       PT SHORT TERM GOAL #1   Title be independent in initial HEP    Time 4    Period Weeks    Status New    Target Date 03/25/21      PT SHORT TERM GOAL #2   Title report a 30% reduction in neck pain with turning head and looking down    Time 4    Period Weeks    Status New    Target Date 03/25/21      PT SHORT TERM GOAL #3   Title report postural corrections with desk/computer work to  improve alignment and reduce strain on neck and postural stabilizers    Time 4    Period Weeks    Status New    Target Date 03/25/21               PT Long Term Goals - 02/25/21 0946       PT LONG TERM GOAL #1   Title be independent in advanced HEP    Time 8    Period Weeks    Target Date 04/22/21      PT LONG TERM GOAL #2   Title report > or = to 70% reduction in Rt neck pain with daily tasks and driving    Time 8    Period Weeks    Status New    Target Date 04/22/21      PT LONG TERM GOAL #3   Title improve FOTO to > or = to 79    Baseline 71    Time 8    Period Weeks    Status New    Target Date 04/22/21      PT LONG TERM GOAL #4   Title improve postural strength to sit with neutral posture > or = to 75% of the time  and report corrections with daily tasks    Time 8    Period Weeks    Status New    Target Date 04/22/21                   Plan - 03/23/21 0905     Clinical Impression Statement Pt continues to see improvement indicated by more localized area of pain on Rt side at base of skull that was targeted wit hDN and manual techniques today; he reported no pain with cervical/capital flexion after manual techniques. He was able to increase resistance to all current exercises, demosntrating improved strength and endurance, while maintaining improved postural awareness. He was able to progress exercise sto include more  functional strengthening, like bent rows and overhead press, to focus on head/neck control throughout these movements and include shoulder girdle/core strengthening. He will continue to benefit from skilled PT intervention in order  to continue progressing strengthening and work towards goal completion.    PT Treatment/Interventions ADLs/Self Care Home Management;Moist Heat;Electrical Stimulation;Cryotherapy;Traction;Therapeutic activities;Therapeutic exercise;Neuromuscular re-education;Manual techniques;Taping;Dry needling;Passive range of  motion;Spinal Manipulations;Joint Manipulations    PT Next Visit Plan DN to neck, upper traps and occiput next session, review HEP, add postural strength.  Pt will have limited attendance due to work schedule.    PT Home Exercise Plan Access Code: [email or web address]    Consulted and Agree with Plan of Care Patient             Patient will benefit from skilled therapeutic intervention in order to improve the following deficits and impairments:  Decreased activity tolerance, Decreased strength, Postural dysfunction, Pain, Decreased endurance, Increased muscle spasms  Visit Diagnosis: Cervicalgia  Cramp and spasm  Abnormal posture     Problem List There are no problems to display for this patient.   Julio Alm, PT, DPT02/13/239:25 AM   [facility] @ [facility] [address] [place], [place], [address] Phone: [phone]   Fax:  [phone]  Name: ELLIE DEMETRO MRN: [id number] Date of Birth: 1980-07-26

## 2021-03-25 DIAGNOSIS — G4730 Sleep apnea, unspecified: Secondary | ICD-10-CM | POA: Diagnosis not present

## 2021-03-25 DIAGNOSIS — I10 Essential (primary) hypertension: Secondary | ICD-10-CM | POA: Diagnosis not present

## 2021-03-25 DIAGNOSIS — R7303 Prediabetes: Secondary | ICD-10-CM | POA: Diagnosis not present

## 2021-03-25 DIAGNOSIS — E7800 Pure hypercholesterolemia, unspecified: Secondary | ICD-10-CM | POA: Diagnosis not present

## 2021-03-30 ENCOUNTER — Ambulatory Visit: Payer: Federal, State, Local not specified - PPO

## 2021-03-30 ENCOUNTER — Other Ambulatory Visit: Payer: Self-pay

## 2021-03-30 DIAGNOSIS — M542 Cervicalgia: Secondary | ICD-10-CM | POA: Diagnosis not present

## 2021-03-30 DIAGNOSIS — R252 Cramp and spasm: Secondary | ICD-10-CM | POA: Diagnosis not present

## 2021-03-30 DIAGNOSIS — G4733 Obstructive sleep apnea (adult) (pediatric): Secondary | ICD-10-CM | POA: Diagnosis not present

## 2021-03-30 DIAGNOSIS — R293 Abnormal posture: Secondary | ICD-10-CM

## 2021-03-30 NOTE — Therapy (Signed)
[facility] @ [facility] [address] [place], [place], [address] Phone: [phone]   Fax:  [phone]  Physical Therapy Treatment  Patient Details  Name: Thomas Tate MRN: [id number] Date of Birth: Dec 18, 1980 Referring Provider (PT): Huel Cote, MD   Encounter Date: 03/30/2021   PT End of Session - 03/30/21 1049     Visit Number 5    Date for PT Re-Evaluation 04/22/21    Authorization Type BCBS Federal    PT Start Time 1010    PT Stop Time 1053    PT Time Calculation (min) 43 min    Activity Tolerance Patient tolerated treatment well    Behavior During Therapy [facility] for tasks assessed/performed             Past Medical History:  Diagnosis Date   Hyperlipidemia    Hypertension     Past Surgical History:  Procedure Laterality Date   SHOULDER ARTHROSCOPY Right 2012    There were no vitals filed for this visit.   Subjective Assessment - 03/30/21 1009     Subjective Pt states that he is overall feeling very good. He reports that over the weekend he forgot that he has neck pain. He feels fine with Bil rotation and states that he may have 1/10 pain when really trying to look down. He has not been as active with HEP, but states that he has been going to the gym more often.    Patient Stated Goals reduce neck pain, reduce muscle tension    Currently in Pain? No/denies    Multiple Pain Sites No                               [facility] Adult PT Treatment/Exercise - 03/30/21 0001       Neck Exercises: Theraband   Shoulder Extension 20 reps;Green      Neck Exercises: Standing   Other Standing Exercises Bent rows, 10lbs, performed unilaterally, 2 x 10 bil; Overhead press 2 x 10, 10lb; Overhead press 2 x 10 bil 10lbs bil    Other Standing Exercises Standing resisted wall slides with red TB 2 x 10      Neck Exercises: Seated   Neck Retraction 10 reps   prone   Money 20 reps   green   Upper Extremity D2  20 reps;Flexion    Theraband Level (UE D2) Level 3 (Green)    Other Seated Exercise Horizontal abduction 2 x 10 green band, chin tuck      Neck Exercises: Supine   Other Supine Exercise Chest press 3 x 10 10lbs bil; serratus punch 10lbs bil 3 x 10      Neck Exercises: Prone   Other Prone Exercise Table push-up 2 x 10      Manual Therapy   Manual Therapy Soft tissue mobilization    Manual therapy comments DN    Soft tissue mobilization to Rt cervical paraspinals and suboccipitals              Trigger Point Dry Needling - 03/30/21 0001     Consent Given? Yes    Education Handout Provided Previously provided    Muscles Treated Head and Neck Cervical multifidi    Cervical multifidi Response Twitch reponse elicited;Palpable increased muscle length                   PT Education - 03/30/21 1036  Education Details Pt education performed on exericse progressions. HEP not updated due to limited performance at home and frequently going to gym work out classes. He was encouraged to perform existing HEP several times a week to work on smaller cervical/shoulder stabilizing muscles. D/C planning.    Person(s) Educated Patient    Methods Explanation;Demonstration;Tactile cues;Verbal cues;Handout    Comprehension Verbalized understanding              PT Short Term Goals - 02/25/21 0945       PT SHORT TERM GOAL #1   Title be independent in initial HEP    Time 4    Period Weeks    Status New    Target Date 03/25/21      PT SHORT TERM GOAL #2   Title report a 30% reduction in neck pain with turning head and looking down    Time 4    Period Weeks    Status New    Target Date 03/25/21      PT SHORT TERM GOAL #3   Title report postural corrections with desk/computer work to improve alignment and reduce strain on neck and postural stabilizers    Time 4    Period Weeks    Status New    Target Date 03/25/21               PT Long Term Goals - 02/25/21 0946        PT LONG TERM GOAL #1   Title be independent in advanced HEP    Time 8    Period Weeks    Target Date 04/22/21      PT LONG TERM GOAL #2   Title report > or = to 70% reduction in Rt neck pain with daily tasks and driving    Time 8    Period Weeks    Status New    Target Date 04/22/21      PT LONG TERM GOAL #3   Title improve FOTO to > or = to 79    Baseline 71    Time 8    Period Weeks    Status New    Target Date 04/22/21      PT LONG TERM GOAL #4   Title improve postural strength to sit with neutral posture > or = to 75% of the time  and report corrections with daily tasks    Time 8    Period Weeks    Status New    Target Date 04/22/21                   Plan - 03/30/21 1042     Clinical Impression Statement Pt is doing very well dmeonstrated by low pain levels and only with terminal cervical flexion. DN and soft tissue mobilization was performed to Rt suboccipitals and cervical paraspinals with good reduction in restriction and pt reported no pain after treatment. He tolerated all exercise progressions to help improve functional strengthening and shoulder/cervical stability well demonstrated by no incresae in pain; these progressions included chest press, resisted wall slide, serratus punch, and over head press in addition to increased repetitions with many exercises. Due to good progress, we discussed probably D/C at next visit if there are no changes in overall condition. He will continue to benefit from skilled PT intervention in order to continue progressing strengthening and work towards goal completion.    PT Treatment/Interventions ADLs/Self Care Home Management;Moist Heat;Electrical Stimulation;Cryotherapy;Traction;Therapeutic activities;Therapeutic exercise;Neuromuscular re-education;Manual techniques;Taping;Dry needling;Passive  range of motion;Spinal Manipulations;Joint Manipulations    PT Next Visit Plan Plan to progress exercises, answer any questions,  and D/C is there are no changes in condition.    PT Home Exercise Plan Access Code: [email or web address]    Consulted and Agree with Plan of Care Patient             Patient will benefit from skilled therapeutic intervention in order to improve the following deficits and impairments:  Decreased activity tolerance, Decreased strength, Postural dysfunction, Pain, Decreased endurance, Increased muscle spasms  Visit Diagnosis: Cervicalgia  Cramp and spasm  Abnormal posture     Problem List There are no problems to display for this patient.   Julio Alm, PT, DPT02/20/2310:55 AM   [facility] @ [facility] [address] [place], [place], [address] Phone: [phone]   Fax:  [phone]  Name: Thomas Tate MRN: [id number] Date of Birth: 1980/12/05

## 2021-04-07 ENCOUNTER — Ambulatory Visit: Payer: Federal, State, Local not specified - PPO

## 2021-04-07 ENCOUNTER — Other Ambulatory Visit: Payer: Self-pay

## 2021-04-07 DIAGNOSIS — R293 Abnormal posture: Secondary | ICD-10-CM

## 2021-04-07 DIAGNOSIS — M542 Cervicalgia: Secondary | ICD-10-CM | POA: Diagnosis not present

## 2021-04-07 DIAGNOSIS — R252 Cramp and spasm: Secondary | ICD-10-CM

## 2021-04-07 NOTE — Therapy (Signed)
[facility] @ [place], [place], [address] Phone: [phone]   Fax:  [phone]  Physical Therapy Treatment  Patient Details  Name: Thomas Tate MRN: [id number] Date of Birth: 13-May-1980 Referring Provider (PT): Vanetta Mulders, MD   Encounter Date: 04/07/2021   PT End of Session - 04/07/21 0838     Visit Number 6    Date for PT Re-Evaluation 04/22/21    Authorization Type [employer]    PT Start Time 0802    PT Stop Time 0838    PT Time Calculation (min) 36 min    Activity Tolerance Patient tolerated treatment well    Behavior During Therapy [facility] for tasks assessed/performed             Past Medical History:  Diagnosis Date   Hyperlipidemia    Hypertension     Past Surgical History:  Procedure Laterality Date   SHOULDER ARTHROSCOPY Right 2012    There were no vitals filed for this visit.   Subjective Assessment - 04/07/21 0804     Subjective I feel 99% overall improvement.    Pertinent History HTN, Rt shoulder scope (2012)    Currently in Pain? No/denies                [facility] PT Assessment - 04/07/21 0001       Assessment   Medical Diagnosis neck pain on Rt side    Referring Provider (PT) Vanetta Mulders, MD    Onset Date/Surgical Date 11/12/20      Prior Function   Level of Independence Independent      Cognition   Overall Cognitive Status Within Functional Limits for tasks assessed                           [facility] Adult PT Treatment/Exercise - 04/07/21 0001       Neck Exercises: Theraband   Shoulder Extension 20 reps;Blue      Neck Exercises: Seated   Upper Extremity D2 20 reps;Flexion    Theraband Level (UE D2) Level 4 (Blue)    Other Seated Exercise Horizontal abduction 2 x 10 blue band, chin tuck      Manual Therapy   Manual Therapy Soft tissue mobilization    Manual therapy comments skilled palpation and monitoring DN               Trigger Point Dry Needling - 04/07/21 0001     Consent Given? Yes    Education Handout Provided Previously provided    Muscles Treated Head and Neck Cervical multifidi;Upper trapezius    Upper Trapezius Response Twitch reponse elicited;Palpable increased muscle length    Cervical multifidi Response Twitch reponse elicited;Palpable increased muscle length                     PT Short Term Goals - 02/25/21 0945       PT SHORT TERM GOAL #1   Title be independent in initial HEP    Time 4    Period Weeks    Status New    Target Date 03/25/21      PT SHORT TERM GOAL #2   Title report a 30% reduction in neck pain with turning head and looking down    Time 4    Period Weeks    Status New    Target Date 03/25/21      PT SHORT  TERM GOAL #3   Title report postural corrections with desk/computer work to improve alignment and reduce strain on neck and postural stabilizers    Time 4    Period Weeks    Status New    Target Date 03/25/21               PT Long Term Goals - 04/07/21 0807       PT LONG TERM GOAL #1   Title be independent in advanced HEP    Status Achieved      PT LONG TERM GOAL #2   Title report > or = to 70% reduction in Rt neck pain with daily tasks and driving    Baseline 28%    Status Achieved      PT LONG TERM GOAL #4   Title improve postural strength to sit with neutral posture > or = to 75% of the time  and report corrections with daily tasks    Baseline working on this now                   Plan - 04/07/21 0840     Clinical Impression Statement Pt reports 99% overall improvement in symptoms since the start of care.  Pt is working on postural adjustments and has HEP in place for strength progression to improve alignment.  PT provided review of all HEP and band was advanced to blue for home.  Pt with improved tissue mobility after DN with multiple twitch responses today.  Pt will D/C today to HEP.    PT Next Visit Plan D/C PT to  HEP    PT Home Exercise Plan Access Code: [email or web address]    Consulted and Agree with Plan of Care Patient             Patient will benefit from skilled therapeutic intervention in order to improve the following deficits and impairments:     Visit Diagnosis: Cervicalgia  Cramp and spasm  Abnormal posture     Problem List There are no problems to display for this patient. PHYSICAL THERAPY DISCHARGE SUMMARY  Visits from Start of Care: 6  Current functional level related to goals / functional outcomes: See above for goal status.    Remaining deficits: No functional deficits remain at this time.   Education / Equipment: HEP, posture    Patient agrees to discharge. Patient goals were met. Patient is being discharged due to meeting the stated rehab goals.  Sigurd Sos, PT 04/07/21 8:43 AM   [facility] @ [place], [place], [address] Phone: [phone]   Fax:  [phone]  Name: RANNIE CRANEY MRN: [id number] Date of Birth: 03-25-1980

## 2021-05-27 IMAGING — CT CT ABD-PELV W/O CM
1 of 2 series · 13 of 32 positions shown, 19 images · non-contrast
Comparison: None.

CLINICAL DATA: Left lower quadrant pain and bloating for 5 days.

EXAM:
CT ABDOMEN AND PELVIS WITHOUT CONTRAST
TECHNIQUE: Multidetector CT imaging of the abdomen and pelvis was performed
following the standard protocol without IV contrast.

[Series 2: abd/pelvis w/(date) · axial · 0.89mm/px · z∈[-466,-36]mm · 13 of 100 slices shown, 19 images]
[im 7/100  soft-tissue]
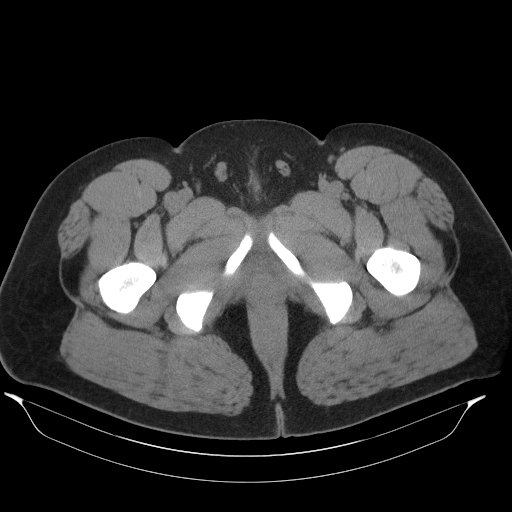
[im 7/100  bone]
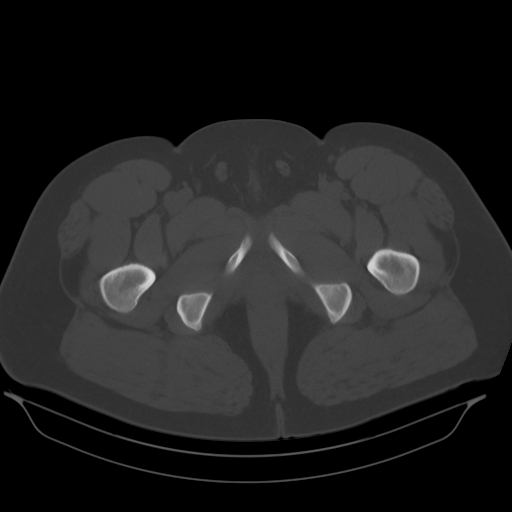
[im 14/100  soft-tissue]
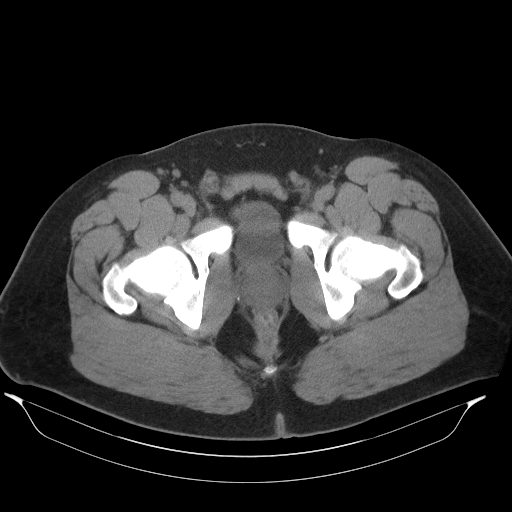
[im 20/100  soft-tissue]
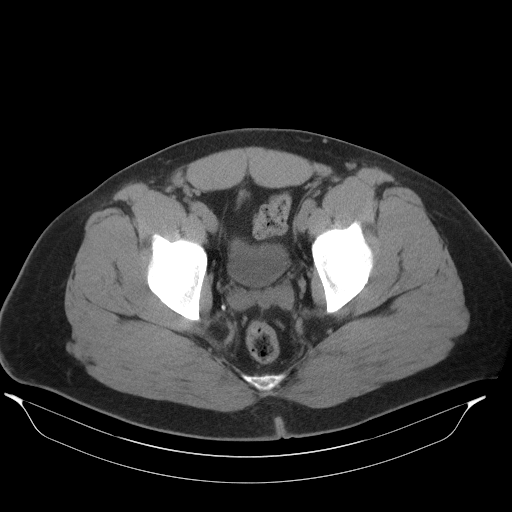
[im 27/100  soft-tissue]
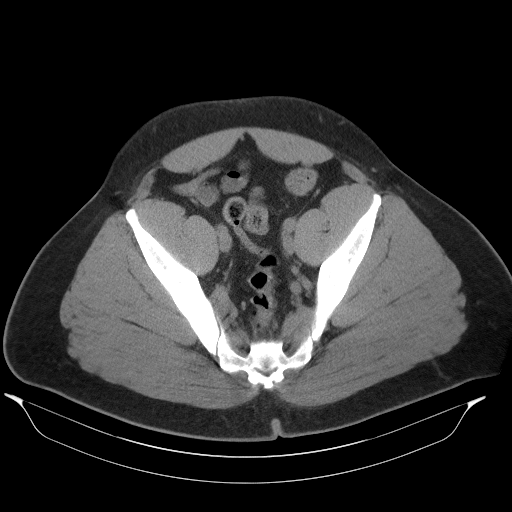
[im 34/100  soft-tissue]
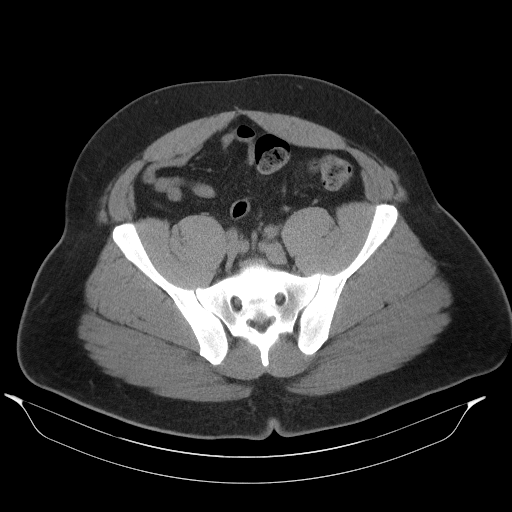
[im 40/100  soft-tissue]
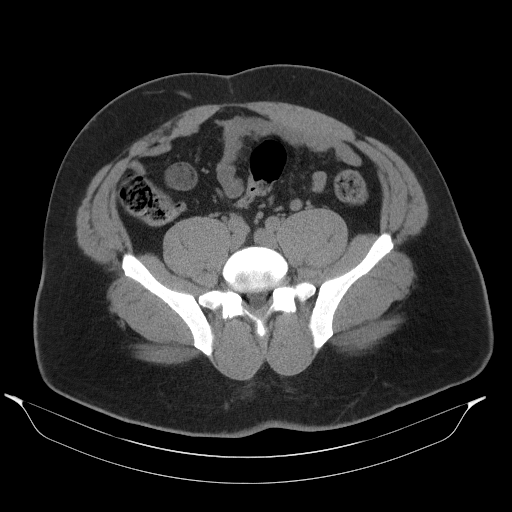
[im 53/100  soft-tissue]
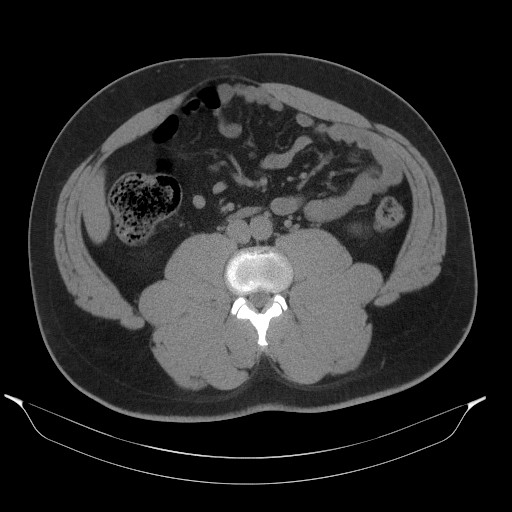
[im 60/100  soft-tissue]
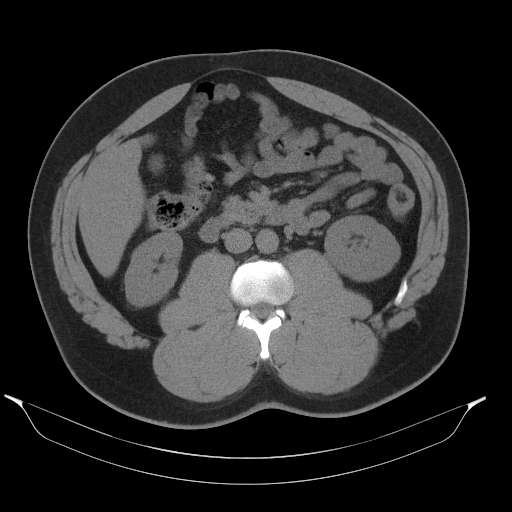
[im 67/100  soft-tissue]
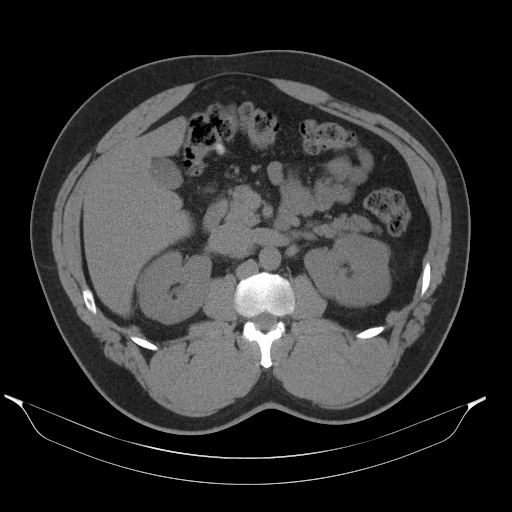
[im 67/100  bone]
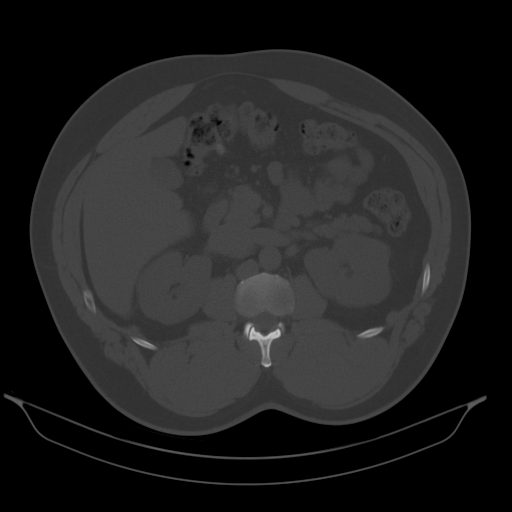
[im 73/100  soft-tissue]
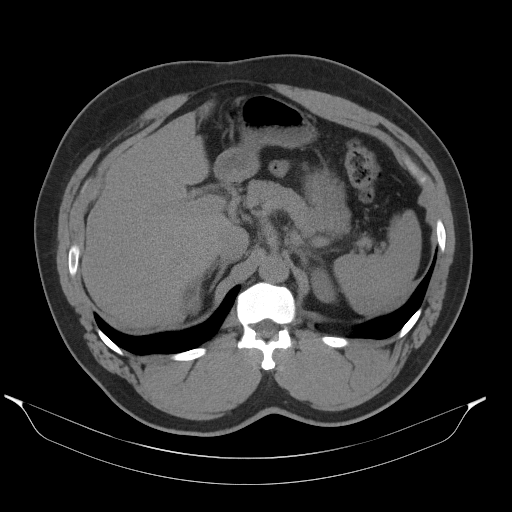
[im 73/100  lung]
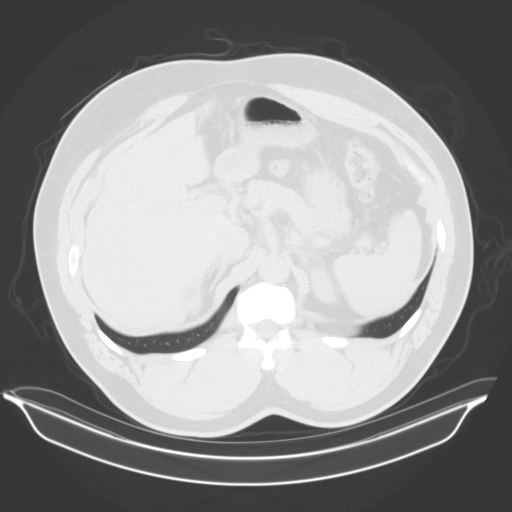
[im 80/100  soft-tissue]
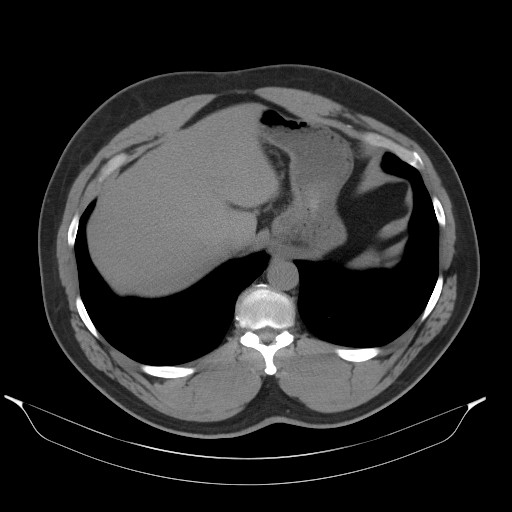
[im 80/100  lung]
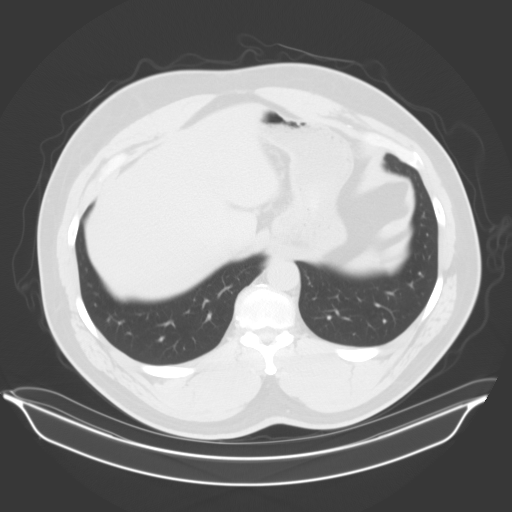
[im 86/100  soft-tissue]
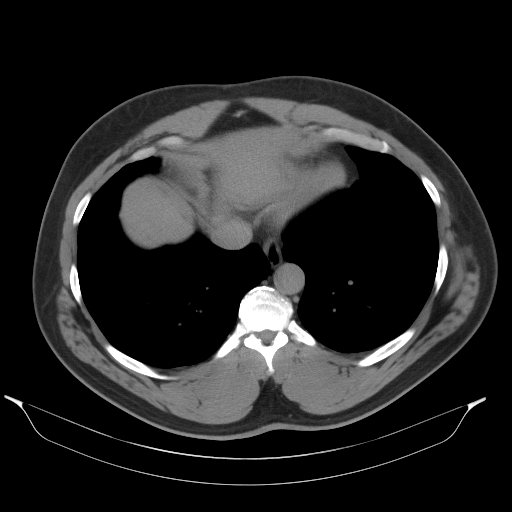
[im 86/100  lung]
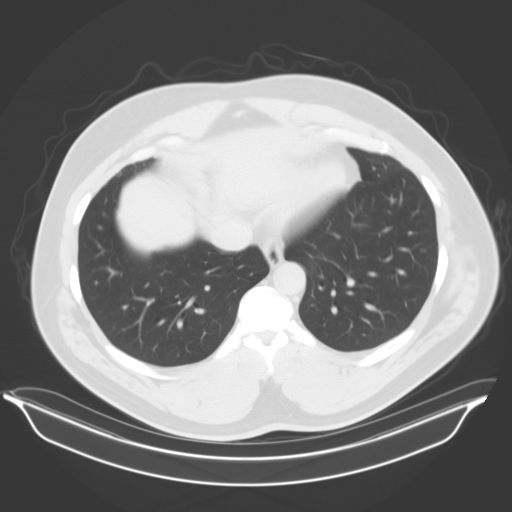
[im 93/100  soft-tissue]
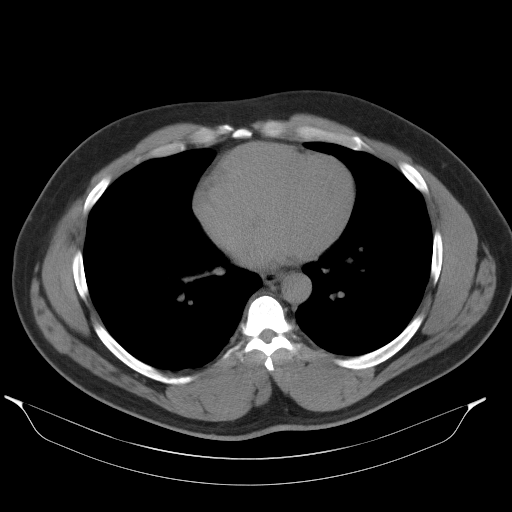
[im 93/100  lung]
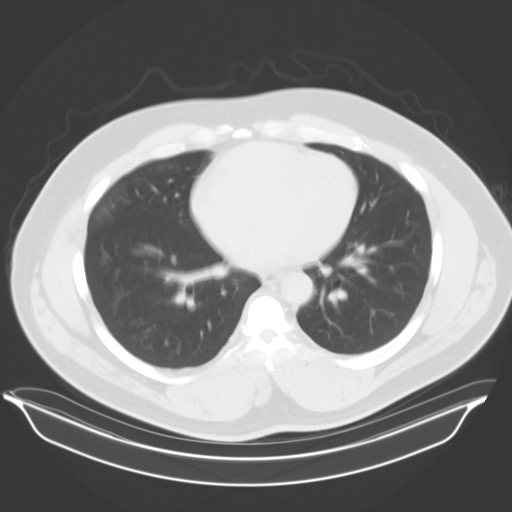

[13 of 32 positions shown; findings below may reference images not displayed]

FINDINGS: Lower chest: Lung bases clear.  No pleural or pericardial effusion.

Hepatobiliary: No focal liver abnormality is seen. No gallstones,
gallbladder wall thickening, or biliary dilatation. The liver is low
attenuating consistent with fatty infiltration.

Pancreas: Unremarkable. No pancreatic ductal dilatation or
surrounding inflammatory changes.

Spleen: Normal in size without focal abnormality.

Adrenals/Urinary Tract: Adrenal glands are unremarkable. Kidneys are
normal, without renal calculi, focal lesion, or hydronephrosis.
Bladder is unremarkable.

Stomach/Bowel: Stomach is within normal limits. Appendix appears
normal. No evidence of bowel wall thickening, distention, or
inflammatory changes. A few scattered diverticula are noted.

Vascular/Lymphatic: No significant vascular findings are present. No
enlarged abdominal or pelvic lymph nodes.

Reproductive: Prostate is unremarkable.

Other: None.

Musculoskeletal: No acute or focal abnormality.
IMPRESSION: No acute abnormality or finding to explain the patient's symptoms.

Mild diverticulosis without diverticulitis.

Fatty infiltration of the liver.

## 2021-06-01 ENCOUNTER — Ambulatory Visit (HOSPITAL_COMMUNITY)
Admission: RE | Admit: 2021-06-01 | Discharge: 2021-06-01 | Disposition: A | Discharge status: Home / Self Care | Payer: Federal, State, Local not specified - PPO | Source: Ambulatory Visit | Attending: Sports Medicine | Admitting: Sports Medicine

## 2021-06-01 ENCOUNTER — Encounter (HOSPITAL_COMMUNITY): Payer: Self-pay

## 2021-06-01 VITALS — BP 148/100 | HR 61 | Temp 98.1°F | Resp 18

## 2021-06-01 DIAGNOSIS — M79645 Pain in left finger(s): Secondary | ICD-10-CM

## 2021-06-01 DIAGNOSIS — L03012 Cellulitis of left finger: Secondary | ICD-10-CM | POA: Diagnosis not present

## 2021-06-01 MED ORDER — MUPIROCIN CALCIUM 2 % EX CREA: 1.0000 "application " | TOPICAL_CREAM | Freq: Three times a day (TID) | CUTANEOUS | 0 refills | Status: AC

## 2021-06-01 NOTE — ED Triage Notes (Signed)
Pt reports jammed finger at batting cage 2 weeks ago. Reports swelling and pain to left middle finger. Reports has had drainage that started yesterday.  ?

## 2021-06-01 NOTE — Discharge Instructions (Addendum)
-   Warm compress either an Epsom salt soak or warm compress with washcloth -3 times a day for 10 minutes each ?- Apply topical Bactroban antibiotic ointment to a dry finger 3 times a day.  Let this sit for 5 to 10 minutes until it soaks in ?-If pain continues or it is no longer self draining and you have a buildup of redness or white discharge that will not be expressed out, follow-up here or with your PCP ?

## 2021-06-01 NOTE — ED Provider Notes (Signed)
?[facility] ? ? ? ?CSN: [id number] ?Arrival date & time: 06/01/21  2683 ? ? ?  ? ?History   ?Chief Complaint ?Chief Complaint  ?Patient presents with  ? appt 930  ? ? ?HPI ?Thomas Tate is a 41 y.o. male here for left middle finger pain. ? ?HPI ? ?Patient states about 2 weeks ago or so he jammed his finger while swimming in the batting cages.  He had some pain on the medial volar aspect of the right middle finger.  This started feeling better initially, but then after a few days noticed some redness and swelling on the distal tip of the finger.  He had quite significant tenderness within the medial side of the nailbed.  Starting yesterday he had some whitish drainage that was coming from the area.  Now that it is draining, the pain has improved somewhat.  Denies any cuts at the time.  Denies previous injury to this finger.  He has not taken any medication nor applied anything over top of the finger for relief.  He is not diabetic.  He does not have any fever or chills.  He denies any spreading of redness or the swelling.  He works for [employer] and notes that it is somewhat tender when he is holding his gun. ? ? ?Past Medical History:  ?Diagnosis Date  ? Hyperlipidemia   ? Hypertension   ? ? ?There are no problems to display for this patient. ? ? ?Past Surgical History:  ?Procedure Laterality Date  ? SHOULDER ARTHROSCOPY Right 2012  ? ? ? ? ? ?Home Medications   ? ?Prior to Admission medications   ?Medication Sig Start Date End Date Taking? Authorizing Provider  ?mupirocin cream (BACTROBAN) 2 % Apply 1 application. topically 3 (three) times daily for 7 days. 06/01/21 06/08/21 Yes Madelyn Brunner, DO  ?benazepril-hydrochlorthiazide (LOTENSIN HCT) 10-12.5 MG tablet Take 1 tablet by mouth daily.    [provider]  ?simvastatin (ZOCOR) 10 MG tablet Take 10 mg by mouth daily.    [provider]  ? ? ?Family History ?No family history on file. ? ?Social History ?  ? ? ?Allergies   ?Patient  has no known allergies. ? ? ?Review of Systems ?Review of Systems  ?Constitutional:  Negative for chills and fever.  ?Musculoskeletal:  Positive for arthralgias (left middle finger).  ?Skin:  Positive for color change. Negative for wound.  ?     + clear, yellow drainage from nail  ? ? ?Physical Exam ?Triage Vital Signs ?ED Triage Vitals  ?Enc Vitals Group  ?   BP 06/01/21 0952 (!) 148/100  ?   Pulse Rate 06/01/21 0952 61  ?   Resp 06/01/21 0952 18  ?   Temp 06/01/21 0952 98.1 ?F (36.7 ?C)  ?   Temp Source 06/01/21 0952 Oral  ?   SpO2 06/01/21 0952 97 %  ?   Weight --   ?   Height --   ?   Head Circumference --   ?   Peak Flow --   ?   Pain Score 06/01/21 0950 9  ?   Pain Loc --   ?   Pain Edu? --   ?   Excl. in GC? --   ? ?No data found. ? ?Updated Vital Signs ?BP (!) 148/100 (BP Location: Right Arm)   Pulse 61   Temp 98.1 ?F (36.7 ?C) (Oral)   Resp 18   SpO2 97%  ? ?  Physical Exam ?Constitutional:   ?   General: He is not in acute distress. ?   Appearance: He is not ill-appearing.  ?HENT:  ?   Head: Normocephalic and atraumatic.  ?Eyes:  ?   Extraocular Movements: Extraocular movements intact.  ?   Conjunctiva/sclera: Conjunctivae normal.  ?   Pupils: Pupils are equal, round, and reactive to light.  ?Cardiovascular:  ?   Rate and Rhythm: Normal rate.  ?Pulmonary:  ?   Effort: Pulmonary effort is normal. No respiratory distress.  ?Musculoskeletal:     ?   General: Normal range of motion.  ?   Cervical back: Normal range of motion.  ?   Comments: + left nailbed with some surrounding erythema and swelling of medial nail fold ?+ able to drain the area with direct pressure, clear-yellow discharge expressed ?No TTP at MCP, PIP, or DIP of finger ?Nailbed intact ?FROM, 5/5 strength of the finger ?No ligamental instability. No buotonierre deformity.  ?Cap refill < 2 secs ?NVI  ?Skin: ?   Capillary Refill: Capillary refill takes less than 2 seconds.  ?Neurological:  ?   Mental Status: He is alert.  ?Psychiatric:     ?    Mood and Affect: Mood normal.  ? ? ? ?UC Treatments / Results  ?Labs ?(all labs ordered are listed, but only abnormal results are displayed) ?Labs Reviewed - No data to display ? ?EKG ? ? ?Radiology ?No results found. ? ?Procedures ?Procedures (including critical care time) ? ?Medications Ordered in UC ?Medications - No data to display ? ?Initial Impression / Assessment and Plan / UC Course  ?I have reviewed the triage vital signs and the nursing notes. ? ?Pertinent labs & imaging results that were available during my care of the patient were reviewed by me and considered in my medical decision making (see chart for details). ? ?  ? ?Patient presents with right middle finger pain and subsequent paronychia.  He likely initially jammed the finger, although he has full range of motion and strength today.  No ligamental instability.  He does have draining paronychia, I was able to express a clear-yellow discharge from the finger today without having to perform any incision.  There is no palpable abscess there.  We will treat with warm compress and and warm water soaks 3 times a day.  We will treat with topical Bactroban antibiotic ointment 3 times a day for the next week.  Handout provided on paronychia and home conservative measures she may use.  No restrictions from work at this time.  Strict return precaution provided such as any worsening redness or swelling, if the finger is no longer spontaneously draining, or increased pain.  He is to follow-up here or with his PCP if any of these arise.  He is agreeable and understanding.  Safe for discharge home. ?Final Clinical Impressions(s) / UC Diagnoses  ? ?Final diagnoses:  ?Paronychia of left middle finger  ?Pain of left middle finger  ? ? ? ?Discharge Instructions   ? ?  ?- Warm compress either an Epsom salt soak or warm compress with washcloth -3 times a day for 10 minutes each ?- Apply topical Bactroban antibiotic ointment to a dry finger 3 times a day.  Let this sit  for 5 to 10 minutes until it soaks in ?-If pain continues or it is no longer self draining and you have a buildup of redness or white discharge that will not be expressed out, follow-up here or with your  PCP ? ? ? ? ?ED Prescriptions   ? ? Medication Sig Dispense Auth. Provider  ? mupirocin cream (BACTROBAN) 2 % Apply 1 application. topically 3 (three) times daily for 7 days. 21 g Madelyn Brunner, DO  ? ?  ? ?PDMP not reviewed this encounter. ?  Madelyn Brunner, DO ?06/01/21 1102 ? ?

## 2021-10-14 DIAGNOSIS — E7800 Pure hypercholesterolemia, unspecified: Secondary | ICD-10-CM | POA: Diagnosis not present

## 2021-10-14 DIAGNOSIS — R7303 Prediabetes: Secondary | ICD-10-CM | POA: Diagnosis not present

## 2021-10-14 DIAGNOSIS — I10 Essential (primary) hypertension: Secondary | ICD-10-CM | POA: Diagnosis not present

## 2021-10-14 DIAGNOSIS — G4730 Sleep apnea, unspecified: Secondary | ICD-10-CM | POA: Diagnosis not present

## 2021-10-14 DIAGNOSIS — Z0000 Encounter for general adult medical examination without abnormal findings: Secondary | ICD-10-CM | POA: Diagnosis not present

## 2022-03-26 DIAGNOSIS — Z03818 Encounter for observation for suspected exposure to other biological agents ruled out: Secondary | ICD-10-CM | POA: Diagnosis not present

## 2022-03-26 DIAGNOSIS — R059 Cough, unspecified: Secondary | ICD-10-CM | POA: Diagnosis not present

## 2022-04-14 DIAGNOSIS — E7800 Pure hypercholesterolemia, unspecified: Secondary | ICD-10-CM | POA: Diagnosis not present

## 2022-04-14 DIAGNOSIS — G4730 Sleep apnea, unspecified: Secondary | ICD-10-CM | POA: Diagnosis not present

## 2022-04-14 DIAGNOSIS — R7303 Prediabetes: Secondary | ICD-10-CM | POA: Diagnosis not present

## 2022-04-14 DIAGNOSIS — I10 Essential (primary) hypertension: Secondary | ICD-10-CM | POA: Diagnosis not present

## 2022-06-01 DIAGNOSIS — R7303 Prediabetes: Secondary | ICD-10-CM | POA: Diagnosis not present

## 2022-06-01 DIAGNOSIS — G4730 Sleep apnea, unspecified: Secondary | ICD-10-CM | POA: Diagnosis not present

## 2022-06-01 DIAGNOSIS — I10 Essential (primary) hypertension: Secondary | ICD-10-CM | POA: Diagnosis not present

## 2022-08-24 DIAGNOSIS — G4733 Obstructive sleep apnea (adult) (pediatric): Secondary | ICD-10-CM | POA: Diagnosis not present

## 2022-09-24 DIAGNOSIS — G4733 Obstructive sleep apnea (adult) (pediatric): Secondary | ICD-10-CM | POA: Diagnosis not present

## 2022-10-25 DIAGNOSIS — G4733 Obstructive sleep apnea (adult) (pediatric): Secondary | ICD-10-CM | POA: Diagnosis not present

## 2022-12-02 DIAGNOSIS — R7303 Prediabetes: Secondary | ICD-10-CM | POA: Diagnosis not present

## 2022-12-02 DIAGNOSIS — I10 Essential (primary) hypertension: Secondary | ICD-10-CM | POA: Diagnosis not present

## 2022-12-02 DIAGNOSIS — E7800 Pure hypercholesterolemia, unspecified: Secondary | ICD-10-CM | POA: Diagnosis not present

## 2022-12-02 DIAGNOSIS — Z0000 Encounter for general adult medical examination without abnormal findings: Secondary | ICD-10-CM | POA: Diagnosis not present

## 2022-12-02 DIAGNOSIS — G4730 Sleep apnea, unspecified: Secondary | ICD-10-CM | POA: Diagnosis not present

## 2023-02-07 DIAGNOSIS — G4733 Obstructive sleep apnea (adult) (pediatric): Secondary | ICD-10-CM | POA: Diagnosis not present

## 2023-02-28 ENCOUNTER — Ambulatory Visit (HOSPITAL_BASED_OUTPATIENT_CLINIC_OR_DEPARTMENT_OTHER): Payer: Federal, State, Local not specified - PPO

## 2023-02-28 ENCOUNTER — Encounter (HOSPITAL_BASED_OUTPATIENT_CLINIC_OR_DEPARTMENT_OTHER): Payer: Self-pay | Admitting: Student

## 2023-02-28 ENCOUNTER — Ambulatory Visit (HOSPITAL_BASED_OUTPATIENT_CLINIC_OR_DEPARTMENT_OTHER): Payer: Federal, State, Local not specified - PPO | Admitting: Student

## 2023-02-28 DIAGNOSIS — M542 Cervicalgia: Secondary | ICD-10-CM | POA: Diagnosis not present

## 2023-02-28 DIAGNOSIS — M25512 Pain in left shoulder: Secondary | ICD-10-CM

## 2023-02-28 MED ORDER — MELOXICAM 15 MG PO TABS: 15.0000 mg | ORAL_TABLET | Freq: Every day | ORAL | 0 refills | Status: AC

## 2023-02-28 NOTE — Progress Notes (Unsigned)
Chief Complaint: Left shoulder pain     History of Present Illness:    Thomas Tate is a 43 y.o. male here today for evaluation of left shoulder pain.  He reports that this started about 5 days ago after a workout at the gym.  He does recall performing lateral shoulder raises and farmer carries.  Pain is located mainly in the posterior shoulder and travels up towards the neck.  Rates pain today as severe and has not gotten much relief with Tylenol.  Denies any numbness or tingling or pain in the neck.  He is right-hand dominant.   Surgical History:   None  PMH/PSH/Family History/Social History/Meds/Allergies:    Past Medical History:  Diagnosis Date   Hyperlipidemia    Hypertension    Past Surgical History:  Procedure Laterality Date   SHOULDER ARTHROSCOPY Right 2012   Social History   Socioeconomic History   Marital status: Single    Spouse name: Not on file   Number of children: Not on file   Years of education: Not on file   Highest education level: Not on file  Occupational History   Not on file  Tobacco Use   Smoking status: Not on file   Smokeless tobacco: Not on file  Substance and Sexual Activity   Alcohol use: Not on file   Drug use: Not on file   Sexual activity: Not on file  Other Topics Concern   Not on file  Social History Narrative   Not on file   Social Drivers of Health   Financial Resource Strain: Not on file  Food Insecurity: Not on file  Transportation Needs: Not on file  Physical Activity: Not on file  Stress: Not on file  Social Connections: Not on file   History reviewed. No pertinent family history. No Known Allergies Current Outpatient Medications  Medication Sig Dispense Refill   meloxicam (MOBIC) 15 MG tablet Take 1 tablet (15 mg total) by mouth daily for 10 days. 10 tablet 0   benazepril-hydrochlorthiazide (LOTENSIN HCT) 10-12.5 MG tablet Take 1 tablet by mouth daily.     simvastatin (ZOCOR)  10 MG tablet Take 10 mg by mouth daily.     No current facility-administered medications for this visit.   No results found.  Review of Systems:   A ROS was performed including pertinent positives and negatives as documented in the HPI.  Physical Exam :   Constitutional: NAD and appears stated age Neurological: Alert and oriented Psych: Appropriate affect and cooperative There were no vitals taken for this visit.   Comprehensive Musculoskeletal Exam:    No tenderness to palpation throughout the cervical spine or paraspinal muscles.  Full cervical ROM however there is tenderness in the left upper trapezius worsened with cervical flexion and rotation.  Active left should ROM to 170 degrees forward flexion, 70 degrees external rotation, and internal rotation to L4.  Negative impingement and empty can testing.  Imaging:   Xray (cervical spine 4 views): Normal alignment of the cervical spine without evidence of acute bony abnormality   I personally reviewed and interpreted the radiographs.   Assessment:   43 y.o. male with acute left shoulder pain that began after a workout at the gym.  Symptoms do seem more consistent with muscular pain as this is mostly confined  to the upper trapezius and is worsened with palpation and moving the neck through range of motion.  I have recommended initial conservative treatments including rest, heat, and NSAIDs for which I will start him on a short course of meloxicam.  Would like to have him continue monitoring his symptoms and should this not improve within the next 1 to 2 weeks I have discussed a potential referral to physical therapy.  Plan :    -Start meloxicam 15 mg daily -Return to clinic as needed     I personally saw and evaluated the patient, and participated in the management and treatment plan.  Hazle Nordmann, PA-C Orthopedics

## 2023-03-10 DIAGNOSIS — G4733 Obstructive sleep apnea (adult) (pediatric): Secondary | ICD-10-CM | POA: Diagnosis not present

## 2023-04-08 DIAGNOSIS — G4733 Obstructive sleep apnea (adult) (pediatric): Secondary | ICD-10-CM | POA: Diagnosis not present

## 2023-05-02 DIAGNOSIS — G4730 Sleep apnea, unspecified: Secondary | ICD-10-CM | POA: Diagnosis not present

## 2023-05-02 DIAGNOSIS — I10 Essential (primary) hypertension: Secondary | ICD-10-CM | POA: Diagnosis not present

## 2023-05-02 DIAGNOSIS — E7800 Pure hypercholesterolemia, unspecified: Secondary | ICD-10-CM | POA: Diagnosis not present

## 2023-06-03 DIAGNOSIS — I10 Essential (primary) hypertension: Secondary | ICD-10-CM | POA: Diagnosis not present

## 2023-06-03 DIAGNOSIS — G4730 Sleep apnea, unspecified: Secondary | ICD-10-CM | POA: Diagnosis not present

## 2023-06-03 DIAGNOSIS — R7309 Other abnormal glucose: Secondary | ICD-10-CM | POA: Diagnosis not present

## 2023-06-03 DIAGNOSIS — R7303 Prediabetes: Secondary | ICD-10-CM | POA: Diagnosis not present

## 2023-06-03 DIAGNOSIS — E7800 Pure hypercholesterolemia, unspecified: Secondary | ICD-10-CM | POA: Diagnosis not present

## 2023-06-03 DIAGNOSIS — Z6836 Body mass index (BMI) 36.0-36.9, adult: Secondary | ICD-10-CM | POA: Diagnosis not present

## 2023-10-04 DIAGNOSIS — M9903 Segmental and somatic dysfunction of lumbar region: Secondary | ICD-10-CM | POA: Diagnosis not present

## 2023-10-04 DIAGNOSIS — M9904 Segmental and somatic dysfunction of sacral region: Secondary | ICD-10-CM | POA: Diagnosis not present

## 2023-10-04 DIAGNOSIS — M62838 Other muscle spasm: Secondary | ICD-10-CM | POA: Diagnosis not present

## 2023-10-04 DIAGNOSIS — M9905 Segmental and somatic dysfunction of pelvic region: Secondary | ICD-10-CM | POA: Diagnosis not present

## 2023-10-05 DIAGNOSIS — M9903 Segmental and somatic dysfunction of lumbar region: Secondary | ICD-10-CM | POA: Diagnosis not present

## 2023-10-05 DIAGNOSIS — M62838 Other muscle spasm: Secondary | ICD-10-CM | POA: Diagnosis not present

## 2023-10-05 DIAGNOSIS — M9905 Segmental and somatic dysfunction of pelvic region: Secondary | ICD-10-CM | POA: Diagnosis not present

## 2023-10-05 DIAGNOSIS — M9904 Segmental and somatic dysfunction of sacral region: Secondary | ICD-10-CM | POA: Diagnosis not present

## 2023-10-12 DIAGNOSIS — M9905 Segmental and somatic dysfunction of pelvic region: Secondary | ICD-10-CM | POA: Diagnosis not present

## 2023-10-12 DIAGNOSIS — M62838 Other muscle spasm: Secondary | ICD-10-CM | POA: Diagnosis not present

## 2023-10-12 DIAGNOSIS — M9903 Segmental and somatic dysfunction of lumbar region: Secondary | ICD-10-CM | POA: Diagnosis not present

## 2023-10-12 DIAGNOSIS — M9904 Segmental and somatic dysfunction of sacral region: Secondary | ICD-10-CM | POA: Diagnosis not present

## 2023-11-07 DIAGNOSIS — K080 Exfoliation of teeth due to systemic causes: Secondary | ICD-10-CM | POA: Diagnosis not present

## 2023-12-12 ENCOUNTER — Encounter: Payer: Self-pay | Admitting: Radiology

## 2023-12-12 DIAGNOSIS — G4730 Sleep apnea, unspecified: Secondary | ICD-10-CM | POA: Diagnosis not present

## 2023-12-12 DIAGNOSIS — Z0000 Encounter for general adult medical examination without abnormal findings: Secondary | ICD-10-CM | POA: Diagnosis not present

## 2023-12-12 DIAGNOSIS — E7800 Pure hypercholesterolemia, unspecified: Secondary | ICD-10-CM | POA: Diagnosis not present

## 2023-12-12 DIAGNOSIS — R7303 Prediabetes: Secondary | ICD-10-CM | POA: Diagnosis not present

## 2023-12-12 DIAGNOSIS — I10 Essential (primary) hypertension: Secondary | ICD-10-CM | POA: Diagnosis not present
